# Patient Record
Sex: Female | Born: 1990 | Race: White | Hispanic: No
Health system: Southern US, Community
[De-identification: ages and names within clinical notes are randomized; demographics above are authoritative.]

---

## 2017-09-15 ENCOUNTER — Ambulatory Visit: Payer: Self-pay | Admitting: Nurse Practitioner

## 2017-09-15 VITALS — BP 108/72 | HR 117 | Temp 99.4°F | Resp 20 | Wt 108.6 lb

## 2017-09-15 DIAGNOSIS — J029 Acute pharyngitis, unspecified: Secondary | ICD-10-CM

## 2017-09-15 LAB — POCT RAPID STREP A (OFFICE): Rapid Strep A Screen: NEGATIVE

## 2017-09-15 MED ORDER — AMOXICILLIN 500 MG PO TABS: 500.0000 mg | ORAL_TABLET | Freq: Two times a day (BID) | ORAL | 0 refills | Status: AC

## 2017-09-15 NOTE — Progress Notes (Signed)
Subjective:     Sherri Ortega is a 27 y.o. female who presents for evaluation of sore throat. Associated symptoms include headache, myalgias, nasal blockage, sore throat, swollen glands and white spots in throat. Onset of symptoms was 1 days ago, and have been gradually worsening since that time. She is drinking plenty of fluids. She is unsure if she has had a recent close exposure to someone with proven streptococcal pharyngitis.   The following portions of the patient's history were reviewed and updated as appropriate: allergies, current medications and past medical history.  Review of Systems Constitutional: positive for anorexia, fatigue and malaise, negative for fevers and sweats Eyes: negative Ears, nose, mouth, throat, and face: positive for nasal congestion and sore throat, negative for ear drainage and hoarseness Respiratory: negative Cardiovascular: negative Gastrointestinal: positive for nausea, negative for abdominal pain, change in bowel habits and vomiting Neurological: positive for headaches, negative for dizziness, paresthesia, vertigo and weakness Behavioral/Psych: negative Allergic/Immunologic: negative    Objective:    BP 108/72 (BP Location: Right Arm, Patient Position: Sitting, Cuff Size: Normal)   Pulse (!) 117   Temp 99.4 F (37.4 C) (Oral)   Resp 20   Wt 108 lb 9.6 oz (49.3 kg)   SpO2 98%  General appearance: alert, cooperative and no distress Head: Normocephalic, without obvious abnormality, atraumatic Eyes: conjunctivae/corneas clear. PERRL, EOM's intact. Fundi benign. Ears: normal TM's and external ear canals both ears Nose: turbinates swollen, inflamed, no sinus tenderness Throat: abnormal findings: exudates present, moderate oropharyngeal erythema and right tonsil, uvula midline Lungs: clear to auscultation bilaterally Heart: regular rate and rhythm, S1, S2 normal, no murmur, click, rub or gallop Abdomen: soft, non-tender; bowel sounds normal; no masses,   no organomegaly Pulses: 2+ and symmetric Skin: Skin color, texture, turgor normal. No rashes or lesions Lymph nodes: cervical lymphadenopathy bilaterally, increased on right Neurologic: Grossly normal  Laboratory Strep test done. Results:negative.    Assessment:    Acute pharyngitis, likely  Strep throat.    Plan:    Patient placed on antibiotics. Use of OTC analgesics recommended as well as salt water gargles. Patient advised of the risk of peritonsillar abscess formation. Patient advised that he will be infectious for 24 hours after starting antibiotics. Follow up as needed.  Patient placed on antibiotics due to clinical presentation and inability to send culture.  Patiet verbalized understanding.

## 2017-09-15 NOTE — Patient Instructions (Addendum)

## 2017-09-16 ENCOUNTER — Encounter (INDEPENDENT_AMBULATORY_CARE_PROVIDER_SITE_OTHER): Payer: Self-pay

## 2017-09-16 ENCOUNTER — Telehealth: Payer: Self-pay | Admitting: Sports Medicine

## 2017-09-16 ENCOUNTER — Ambulatory Visit (INDEPENDENT_AMBULATORY_CARE_PROVIDER_SITE_OTHER): Payer: BLUE CROSS/BLUE SHIELD

## 2017-09-16 ENCOUNTER — Other Ambulatory Visit: Payer: Self-pay | Admitting: Sports Medicine

## 2017-09-16 DIAGNOSIS — J36 Peritonsillar abscess: Secondary | ICD-10-CM | POA: Diagnosis not present

## 2017-09-16 DIAGNOSIS — J359 Chronic disease of tonsils and adenoids, unspecified: Secondary | ICD-10-CM | POA: Diagnosis not present

## 2017-09-16 MED ORDER — PREDNISONE 50 MG PO TABS: ORAL_TABLET | ORAL | 0 refills | Status: AC

## 2017-09-16 NOTE — Telephone Encounter (Signed)
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 For the past several days she's had increasing neck swelling, right-sided, unilateral with severe tonsillar hypertrophy, fevers, palpable and tender lymphadenopathy, exudates. She was seen in an urgent care, x-ray for non revealing and a rapid strep test was negative though her centor score is 4. She has a hot potato voice, and we have a concern for a right peritonsillar abscess. Because invasive intervention is a possibility we are going to go ahead and order a stat CT without contrast of the neck and soft tissues for definitive diagnosis.  She is currently on penicillin.

## 2017-09-16 NOTE — Assessment & Plan Note (Signed)
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 For the past several days she's had increasing neck swelling, right-sided, unilateral with severe tonsillar hypertrophy, fevers, palpable and tender lymphadenopathy, exudates. She was seen in an urgent care, x-ray for non revealing and a rapid strep test was negative though her centor score is 4. She has a hot potato voice, and we have a concern for a right peritonsillar abscess. Because invasive intervention is a possibility we are going to go ahead and order a stat CT without contrast of the neck and soft tissues for definitive diagnosis.  She is currently on penicillin. 

## 2017-09-25 ENCOUNTER — Telehealth: Payer: Self-pay | Admitting: Sports Medicine

## 2017-09-25 DIAGNOSIS — I889 Nonspecific lymphadenitis, unspecified: Secondary | ICD-10-CM | POA: Insufficient documentation

## 2017-09-25 NOTE — Telephone Encounter (Signed)
Sherri Ortega returns, she had a right peritonsillar abscess, he responded well to steroids and antibiotics.  She did have an attempted I&D with very little fluid obtained. Like her to be tested for mononucleosis, adding Epstein-Barr virus IgG and IgM titers, I am also going to add cytomegalovirus IgG, might as well add a CBC with differential too.

## 2017-09-25 NOTE — Assessment & Plan Note (Signed)
Checking titers, post PTA I&D.  Persistent LAD.

## 2017-09-29 LAB — CBC WITH DIFFERENTIAL/PLATELET
Basophils Absolute: 25 cells/uL (ref 0–200)
Basophils Relative: 0.2 %
Eosinophils Absolute: 200 cells/uL (ref 15–500)
Eosinophils Relative: 1.6 %
HCT: 34.3 % — ABNORMAL LOW (ref 35.0–45.0)
Hemoglobin: 11.7 g/dL (ref 11.7–15.5)
Lymphs Abs: 2538 cells/uL (ref 850–3900)
MCH: 29.7 pg (ref 27.0–33.0)
MCHC: 34.1 g/dL (ref 32.0–36.0)
MCV: 87.1 fL (ref 80.0–100.0)
MPV: 9.6 fL (ref 7.5–12.5)
Monocytes Relative: 7.8 %
Neutro Abs: 8763 cells/uL — ABNORMAL HIGH (ref 1500–7800)
Neutrophils Relative %: 70.1 %
Platelets: 397 Thousand/uL (ref 140–400)
RBC: 3.94 Million/uL (ref 3.80–5.10)
RDW: 12.2 % (ref 11.0–15.0)
Total Lymphocyte: 20.3 %
WBC mixed population: 975 cells/uL — ABNORMAL HIGH (ref 200–950)
WBC: 12.5 Thousand/uL — ABNORMAL HIGH (ref 3.8–10.8)

## 2017-09-29 LAB — CMV ABS, IGG+IGM (CYTOMEGALOVIRUS)
CMV IgM: <30 AU/mL
Cytomegalovirus Ab-IgG: 3.3 U/mL — ABNORMAL HIGH

## 2017-09-29 LAB — EPSTEIN-BARR VIRUS VCA ANTIBODY PANEL
EBV NA IgG: 342 U/mL — ABNORMAL HIGH
EBV VCA IgG: 76.7 U/mL — ABNORMAL HIGH
EBV VCA IgM: <36 U/mL

## 2018-10-21 IMAGING — CT CT NECK W/O CM
3 of 5 series · 12 of 33 positions shown, 14 images · non-contrast
Comparison: None.

CLINICAL DATA: 26-year-old female with throat swelling, pain,
tenderness, difficulty swallowing. Progressive symptoms for 2 days.
Query right side peritonsillar abscess.

EXAM:
CT NECK WITHOUT CONTRAST
TECHNIQUE: Multidetector CT imaging of the neck was performed following the
standard protocol without intravenous contrast.

[Series 4: sag neck · sagittal · 0.36mm/px · 5 of 56 slices shown, 6 images]
[im 19/56  bone]
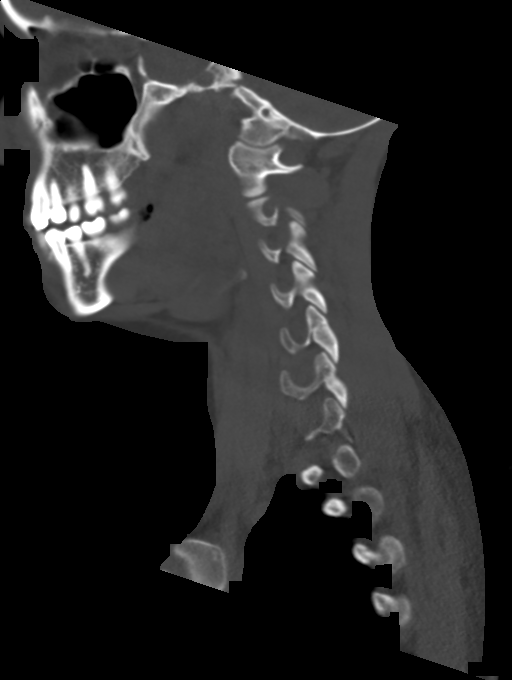
[im 23/56  bone]
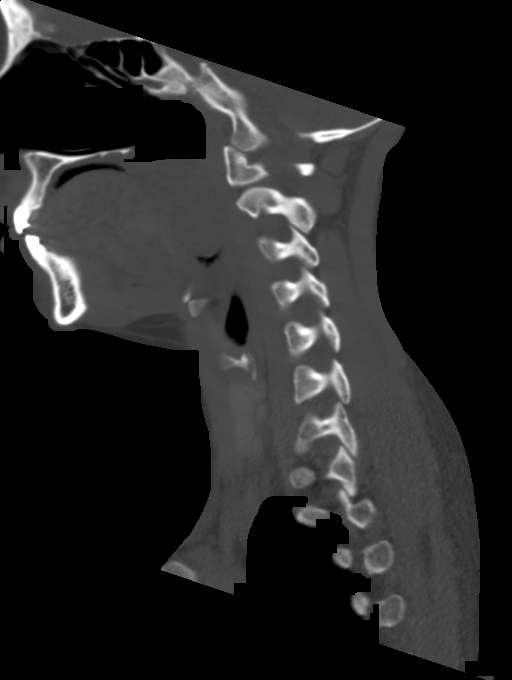
[im 28/56  soft-tissue]
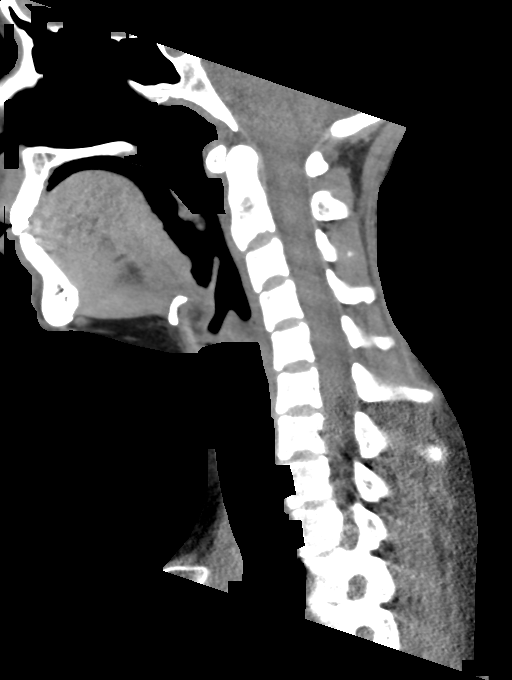
[im 28/56  bone]
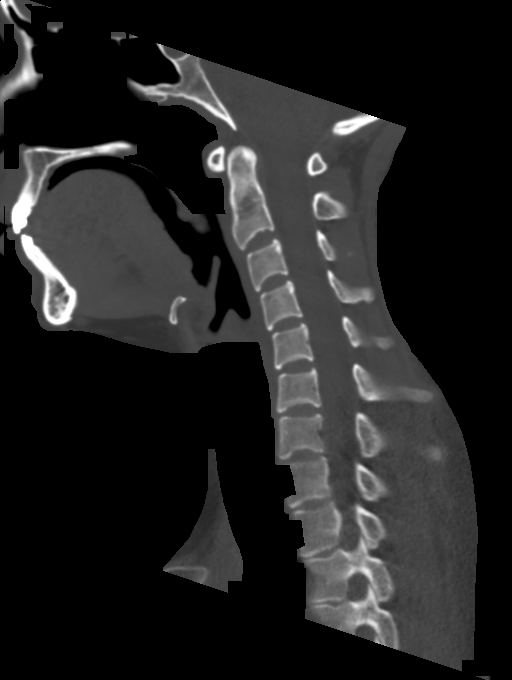
[im 33/56  bone]
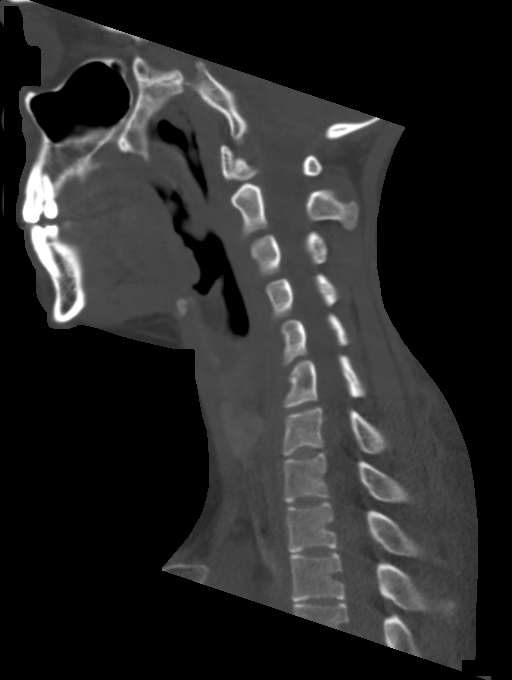
[im 37/56  bone]
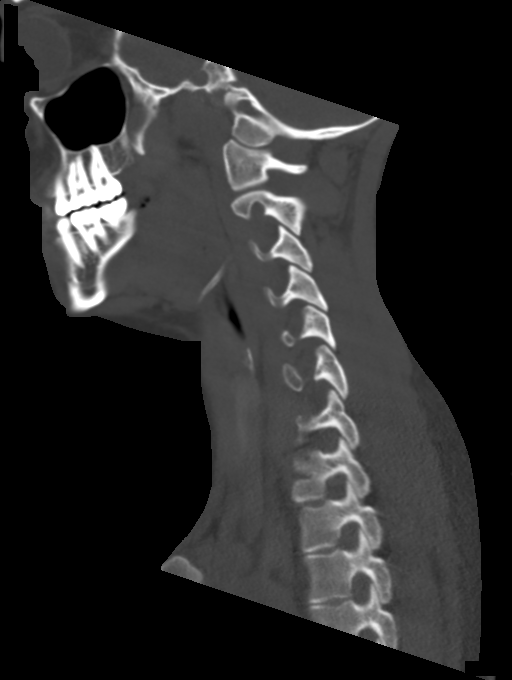

[Series 5: cor neck · coronal · 0.26mm/px · 3 of 98 slices shown]
[im 29/98  bone]
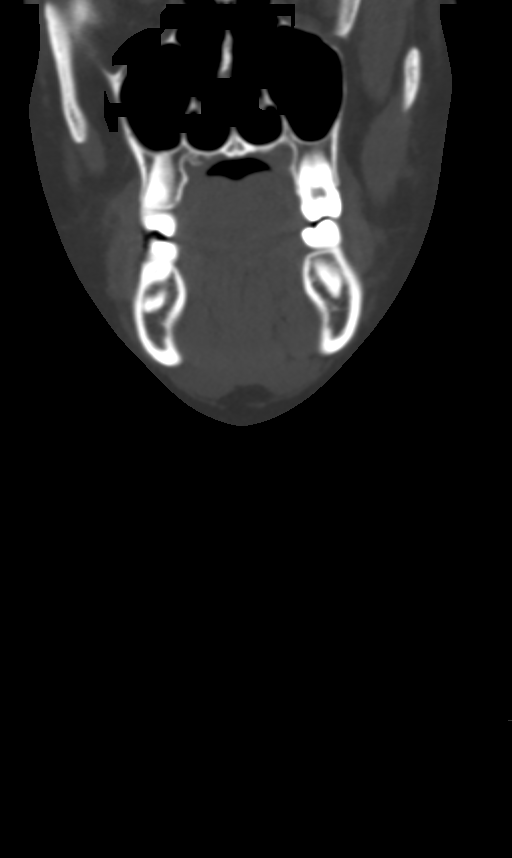
[im 42/98  bone]
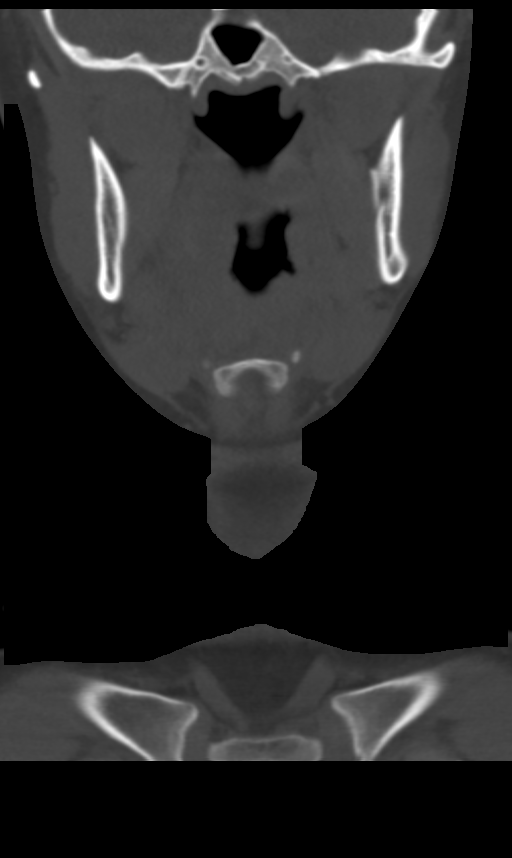
[im 56/98  bone]
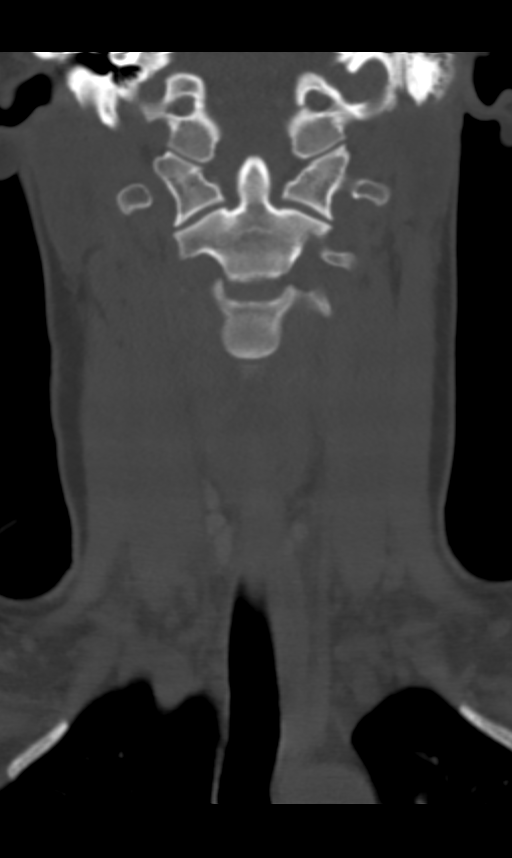

[Series 6: orthogonal ax · axial · 0.33mm/px · z∈[-244,-119]mm · 4 of 106 slices shown, 5 images]
[im 18/106  soft-tissue]
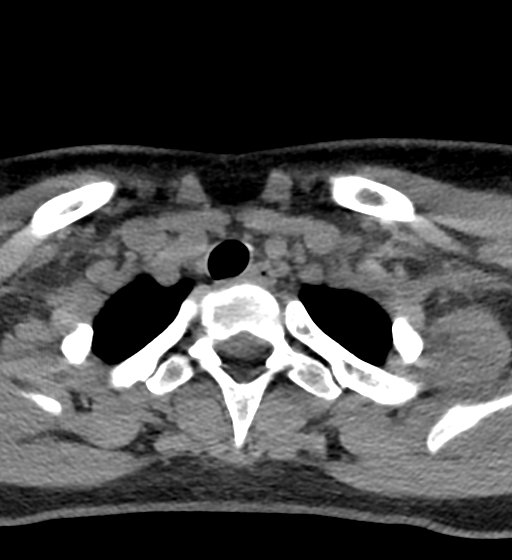
[im 18/106  bone]
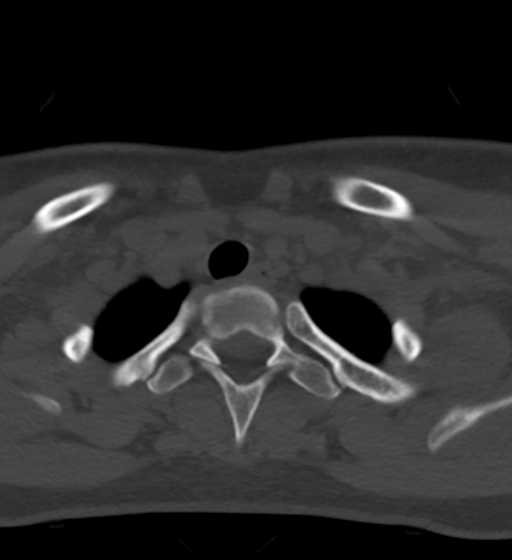
[im 36/106  bone]
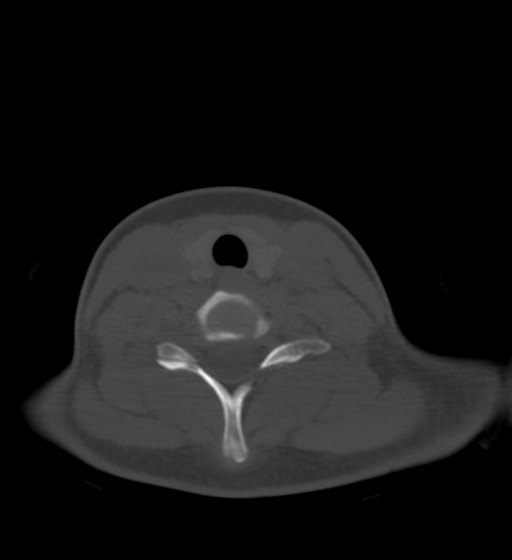
[im 71/106  bone]
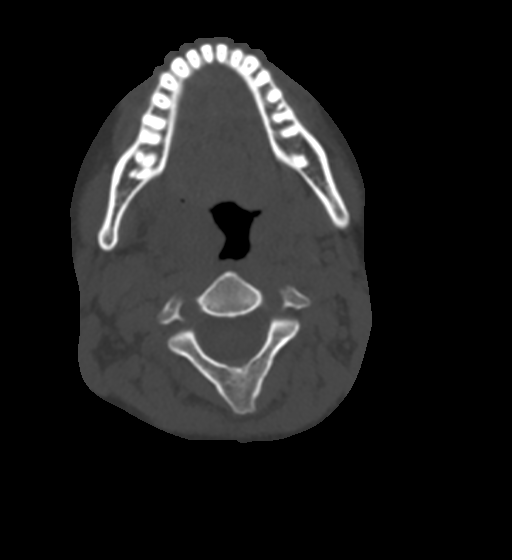
[im 88/106  bone]
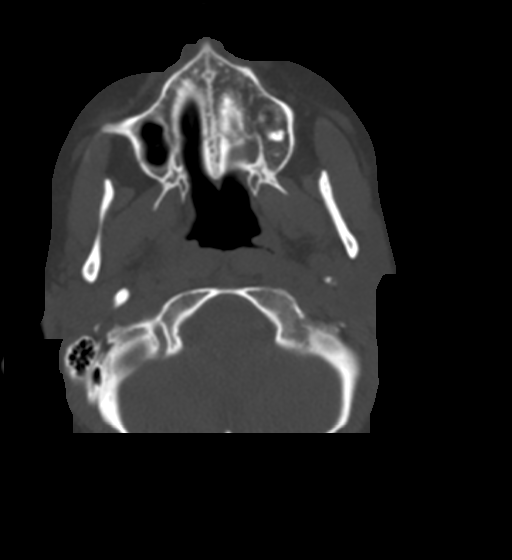

[12 of 33 positions shown; findings below may reference images not displayed]

FINDINGS: Pharynx and larynx: Laryngeal contours are normal. The hypopharynx
appears within normal limits.

There is right greater than left palatine tonsillar enlargement at
the level of the oropharynx, seen on both series 2, image 26 and
coronal image 47. There is asymmetric right parapharyngeal space
stranding (image 17 series 2) which continues along the extent of
the right pharynx. However, without IV contrast there is no discrete
fluid collection or abscess evident.

The adenoids appear more symmetric. The left parapharyngeal space
and the retropharyngeal space appear normal.

Salivary glands: Sublingual space, noncontrast submandibular glands
and parotid glands appear normal.

Thyroid: Negative.

Lymph nodes: Reactive appearing right level 2 lymph nodes,
individually up to 10-12 millimeter short axis. No cystic lymph
nodes are evident. Other bilateral cervical lymph nodes appear
within normal limits.

Vascular: Vascular patency is not evaluated in the absence of IV
contrast.

Limited intracranial: Negative.

Visualized orbits: Negative.

Mastoids and visualized paranasal sinuses: Clear.

Skeleton: Nonspecific reversal of cervical lordosis, but no osseous
abnormality is identified. No dental abnormality identified.
Partially visible mild upper thoracic levoconvex spine curvature.

Upper chest: Negative lung apices. Negative visible noncontrast
superior mediastinum.
IMPRESSION: 1. Asymmetric enlargement of the right palatine tonsil and
asymmetric inflammation in the right parapharyngeal space are both
suspicious for the presence of a right tonsillar abscess.
However, in the absence of IV contrast no discrete fluid collection
is evident.
A repeat CT with IV contrast (could be limited to the face only -
Face CT with IV contrast) would be valuable and is recommended.
2. Reactive appearing right level 2 lymph nodes.
